# Patient Record
Sex: Female | Born: 1960 | Race: White | Hispanic: No | Marital: Married | Smoking: Never smoker
Health system: Southern US, Community
[De-identification: ages and names within clinical notes are randomized; demographics above are authoritative.]

## PROBLEM LIST (undated history)

## (undated) HISTORY — PX: CHOLECYSTECTOMY: SHX55

---

## 2004-01-24 ENCOUNTER — Inpatient Hospital Stay: Payer: Self-pay | Admitting: General Surgery

## 2004-01-24 ENCOUNTER — Other Ambulatory Visit: Payer: Self-pay

## 2017-05-13 ENCOUNTER — Encounter: Payer: Self-pay | Admitting: General Surgery

## 2019-03-31 ENCOUNTER — Emergency Department: Payer: BC Managed Care – PPO

## 2019-03-31 ENCOUNTER — Other Ambulatory Visit: Payer: Self-pay

## 2019-03-31 ENCOUNTER — Encounter: Payer: Self-pay | Admitting: Emergency Medicine

## 2019-03-31 ENCOUNTER — Inpatient Hospital Stay
Admission: EM | Admit: 2019-03-31 | Discharge: 2019-04-27 | DRG: 871 | Disposition: E | Payer: BC Managed Care – PPO | Attending: Internal Medicine | Admitting: Internal Medicine

## 2019-03-31 ENCOUNTER — Inpatient Hospital Stay: Payer: BC Managed Care – PPO

## 2019-03-31 DIAGNOSIS — I5021 Acute systolic (congestive) heart failure: Secondary | ICD-10-CM | POA: Diagnosis present

## 2019-03-31 DIAGNOSIS — R6520 Severe sepsis without septic shock: Secondary | ICD-10-CM

## 2019-03-31 DIAGNOSIS — Z8249 Family history of ischemic heart disease and other diseases of the circulatory system: Secondary | ICD-10-CM | POA: Diagnosis not present

## 2019-03-31 DIAGNOSIS — J9601 Acute respiratory failure with hypoxia: Secondary | ICD-10-CM | POA: Diagnosis present

## 2019-03-31 DIAGNOSIS — A419 Sepsis, unspecified organism: Principal | ICD-10-CM

## 2019-03-31 DIAGNOSIS — Z79899 Other long term (current) drug therapy: Secondary | ICD-10-CM

## 2019-03-31 DIAGNOSIS — N179 Acute kidney failure, unspecified: Secondary | ICD-10-CM | POA: Diagnosis present

## 2019-03-31 DIAGNOSIS — Z20822 Contact with and (suspected) exposure to covid-19: Secondary | ICD-10-CM | POA: Diagnosis present

## 2019-03-31 DIAGNOSIS — I255 Ischemic cardiomyopathy: Secondary | ICD-10-CM | POA: Diagnosis present

## 2019-03-31 DIAGNOSIS — I469 Cardiac arrest, cause unspecified: Secondary | ICD-10-CM | POA: Diagnosis not present

## 2019-03-31 DIAGNOSIS — Z6829 Body mass index (BMI) 29.0-29.9, adult: Secondary | ICD-10-CM

## 2019-03-31 DIAGNOSIS — R6521 Severe sepsis with septic shock: Secondary | ICD-10-CM | POA: Diagnosis present

## 2019-03-31 DIAGNOSIS — R092 Respiratory arrest: Secondary | ICD-10-CM

## 2019-03-31 DIAGNOSIS — Z66 Do not resuscitate: Secondary | ICD-10-CM | POA: Diagnosis not present

## 2019-03-31 DIAGNOSIS — J189 Pneumonia, unspecified organism: Secondary | ICD-10-CM | POA: Diagnosis present

## 2019-03-31 DIAGNOSIS — Z833 Family history of diabetes mellitus: Secondary | ICD-10-CM

## 2019-03-31 DIAGNOSIS — I248 Other forms of acute ischemic heart disease: Secondary | ICD-10-CM | POA: Diagnosis present

## 2019-03-31 DIAGNOSIS — R778 Other specified abnormalities of plasma proteins: Secondary | ICD-10-CM | POA: Diagnosis present

## 2019-03-31 DIAGNOSIS — R0602 Shortness of breath: Secondary | ICD-10-CM

## 2019-03-31 DIAGNOSIS — J9602 Acute respiratory failure with hypercapnia: Secondary | ICD-10-CM | POA: Diagnosis present

## 2019-03-31 DIAGNOSIS — R7989 Other specified abnormal findings of blood chemistry: Secondary | ICD-10-CM | POA: Diagnosis present

## 2019-03-31 DIAGNOSIS — R570 Cardiogenic shock: Secondary | ICD-10-CM | POA: Insufficient documentation

## 2019-03-31 LAB — CBC WITH DIFFERENTIAL/PLATELET
Abs Immature Granulocytes: 0.4 10*3/uL — ABNORMAL HIGH (ref 0.00–0.07)
Basophils Absolute: 0.1 10*3/uL (ref 0.0–0.1)
Basophils Relative: 0 %
Eosinophils Absolute: 0.1 10*3/uL (ref 0.0–0.5)
Eosinophils Relative: 0 %
HCT: 31.1 % — ABNORMAL LOW (ref 36.0–46.0)
Hemoglobin: 8.4 g/dL — ABNORMAL LOW (ref 12.0–15.0)
Immature Granulocytes: 1 %
Lymphocytes Relative: 6 %
Lymphs Abs: 1.8 10*3/uL (ref 0.7–4.0)
MCH: 16.6 pg — ABNORMAL LOW (ref 26.0–34.0)
MCHC: 27 g/dL — ABNORMAL LOW (ref 30.0–36.0)
MCV: 61.6 fL — ABNORMAL LOW (ref 80.0–100.0)
Monocytes Absolute: 1.9 10*3/uL — ABNORMAL HIGH (ref 0.1–1.0)
Monocytes Relative: 7 %
Neutro Abs: 24.6 10*3/uL — ABNORMAL HIGH (ref 1.7–7.7)
Neutrophils Relative %: 86 %
Platelets: 1011 10*3/uL (ref 150–400)
RBC: 5.05 MIL/uL (ref 3.87–5.11)
RDW: 21.9 % — ABNORMAL HIGH (ref 11.5–15.5)
WBC: 28.9 10*3/uL — ABNORMAL HIGH (ref 4.0–10.5)
nRBC: 0.2 % (ref 0.0–0.2)

## 2019-03-31 LAB — D-DIMER, QUANTITATIVE: D-Dimer, Quant: 5.76 ug/mL-FEU — ABNORMAL HIGH (ref 0.00–0.50)

## 2019-03-31 LAB — COMPREHENSIVE METABOLIC PANEL
ALT: 18 U/L (ref 0–44)
AST: 41 U/L (ref 15–41)
Albumin: 2.6 g/dL — ABNORMAL LOW (ref 3.5–5.0)
Alkaline Phosphatase: 109 U/L (ref 38–126)
Anion gap: 11 (ref 5–15)
BUN: 14 mg/dL (ref 6–20)
CO2: 23 mmol/L (ref 22–32)
Calcium: 8 mg/dL — ABNORMAL LOW (ref 8.9–10.3)
Chloride: 101 mmol/L (ref 98–111)
Creatinine, Ser: 1.05 mg/dL — ABNORMAL HIGH (ref 0.44–1.00)
GFR calc Af Amer: >60 mL/min (ref >60)
GFR calc non Af Amer: 59 mL/min — ABNORMAL LOW (ref >60)
Glucose, Bld: 210 mg/dL — ABNORMAL HIGH (ref 70–99)
Potassium: 4 mmol/L (ref 3.5–5.1)
Sodium: 135 mmol/L (ref 135–145)
Total Bilirubin: 0.8 mg/dL (ref 0.3–1.2)
Total Protein: 6.8 g/dL (ref 6.5–8.1)

## 2019-03-31 LAB — BLOOD GAS, ARTERIAL
Acid-base deficit: 0.5 mmol/L (ref 0.0–2.0)
Bicarbonate: 24.2 mmol/L (ref 20.0–28.0)
Expiratory PAP: 10
FIO2: 0.7
Inspiratory PAP: 15
Mode: POSITIVE
O2 Saturation: 89.7 %
Patient temperature: 37
pCO2 arterial: 39 mmHg (ref 32.0–48.0)
pH, Arterial: 7.4 (ref 7.350–7.450)
pO2, Arterial: 58 mmHg — ABNORMAL LOW (ref 83.0–108.0)

## 2019-03-31 LAB — STREP PNEUMONIAE URINARY ANTIGEN: Strep Pneumo Urinary Antigen: NEGATIVE

## 2019-03-31 LAB — POC SARS CORONAVIRUS 2 AG: SARS Coronavirus 2 Ag: NEGATIVE

## 2019-03-31 LAB — URINE DRUG SCREEN, QUALITATIVE (ARMC ONLY)
Amphetamines, Ur Screen: NOT DETECTED
Barbiturates, Ur Screen: NOT DETECTED
Benzodiazepine, Ur Scrn: NOT DETECTED
Cannabinoid 50 Ng, Ur ~~LOC~~: NOT DETECTED
Cocaine Metabolite,Ur ~~LOC~~: NOT DETECTED
MDMA (Ecstasy)Ur Screen: NOT DETECTED
Methadone Scn, Ur: NOT DETECTED
Opiate, Ur Screen: NOT DETECTED
Phencyclidine (PCP) Ur S: NOT DETECTED
Tricyclic, Ur Screen: NOT DETECTED

## 2019-03-31 LAB — PATHOLOGIST SMEAR REVIEW

## 2019-03-31 LAB — PROCALCITONIN: Procalcitonin: 0.75 ng/mL

## 2019-03-31 LAB — RESPIRATORY PANEL BY RT PCR (FLU A&B, COVID)
Influenza A by PCR: NEGATIVE
Influenza B by PCR: NEGATIVE
SARS Coronavirus 2 by RT PCR: NEGATIVE

## 2019-03-31 LAB — LACTIC ACID, PLASMA
Lactic Acid, Venous: 3.7 mmol/L (ref 0.5–1.9)
Lactic Acid, Venous: 2.6 mmol/L (ref 0.5–1.9)

## 2019-03-31 LAB — C-REACTIVE PROTEIN: CRP: 32.3 mg/dL — ABNORMAL HIGH (ref <1.0)

## 2019-03-31 LAB — TROPONIN I (HIGH SENSITIVITY)
Troponin I (High Sensitivity): 35 ng/L — ABNORMAL HIGH (ref <18)
Troponin I (High Sensitivity): 80 ng/L — ABNORMAL HIGH (ref <18)

## 2019-03-31 LAB — FERRITIN: Ferritin: 15 ng/mL (ref 11–307)

## 2019-03-31 LAB — LACTATE DEHYDROGENASE: LDH: 485 U/L — ABNORMAL HIGH (ref 98–192)

## 2019-03-31 MED ORDER — SODIUM CHLORIDE 0.9 % IV SOLN
500.0000 mg | Freq: Once | INTRAVENOUS | Status: DC
  Filled 2019-03-31: qty 500

## 2019-03-31 MED ORDER — SODIUM CHLORIDE 0.9 % IV SOLN
500.0000 mg | Freq: Once | INTRAVENOUS | Status: AC
  Administered 2019-03-31: 07:00:00 500 mg via INTRAVENOUS
  Filled 2019-03-31: qty 500

## 2019-03-31 MED ORDER — VANCOMYCIN HCL 1500 MG/300ML IV SOLN
1500.0000 mg | Freq: Once | INTRAVENOUS | Status: AC
  Administered 2019-03-31: 1500 mg via INTRAVENOUS
  Filled 2019-03-31: qty 300

## 2019-03-31 MED ORDER — SODIUM CHLORIDE 0.9 % IV SOLN: INTRAVENOUS | Status: DC

## 2019-03-31 MED ORDER — ALBUTEROL SULFATE HFA 108 (90 BASE) MCG/ACT IN AERS: 2.0000 | INHALATION_SPRAY | RESPIRATORY_TRACT | Status: DC | PRN

## 2019-03-31 MED ORDER — IOHEXOL 350 MG/ML SOLN
75.0000 mL | Freq: Once | INTRAVENOUS | Status: AC | PRN
  Administered 2019-03-31: 06:00:00 75 mL via INTRAVENOUS

## 2019-03-31 MED ORDER — SODIUM BICARBONATE 8.4 % IV SOLN
50.0000 meq | Freq: Once | INTRAVENOUS | Status: AC
  Administered 2019-03-31: 14:00:00 50 meq via INTRAVENOUS

## 2019-03-31 MED ORDER — PROPOFOL 1000 MG/100ML IV EMUL: 5.0000 ug/kg/min | INTRAVENOUS | Status: DC

## 2019-03-31 MED ORDER — DOPAMINE-DEXTROSE 3.2-5 MG/ML-% IV SOLN
0.0000 ug/kg/min | INTRAVENOUS | Status: DC
  Administered 2019-03-31: 5 ug/kg/min via INTRAVENOUS

## 2019-03-31 MED ORDER — EPINEPHRINE 1 MG/10ML IJ SOSY: 0.1000 mg | PREFILLED_SYRINGE | Freq: Once | INTRAMUSCULAR | Status: DC

## 2019-03-31 MED ORDER — ACETAMINOPHEN 325 MG PO TABS: 650.0000 mg | ORAL_TABLET | Freq: Four times a day (QID) | ORAL | Status: DC | PRN

## 2019-03-31 MED ORDER — ENOXAPARIN SODIUM 40 MG/0.4ML ~~LOC~~ SOLN: 40.0000 mg | SUBCUTANEOUS | Status: DC

## 2019-03-31 MED ORDER — SODIUM CHLORIDE 0.9 % IV SOLN
2.0000 g | Freq: Once | INTRAVENOUS | Status: DC
  Filled 2019-03-31: qty 20

## 2019-03-31 MED ORDER — DM-GUAIFENESIN ER 30-600 MG PO TB12
1.0000 | ORAL_TABLET | Freq: Two times a day (BID) | ORAL | Status: DC
  Filled 2019-03-31 (×2): qty 1

## 2019-03-31 MED ORDER — ATROPINE SULFATE 1 MG/ML IJ SOLN
INTRAMUSCULAR | Status: AC | PRN
  Administered 2019-03-31: 1 mg via INTRAVENOUS

## 2019-03-31 MED ORDER — SODIUM BICARBONATE 8.4 % IV SOLN
INTRAVENOUS | Status: AC | PRN
  Administered 2019-03-31: 50 meq via INTRAVENOUS

## 2019-03-31 MED ORDER — DEXAMETHASONE SODIUM PHOSPHATE 10 MG/ML IJ SOLN
6.0000 mg | Freq: Once | INTRAMUSCULAR | Status: AC
  Administered 2019-03-31: 6 mg via INTRAVENOUS
  Filled 2019-03-31: qty 1

## 2019-03-31 MED ORDER — IPRATROPIUM BROMIDE HFA 17 MCG/ACT IN AERS: 2.0000 | INHALATION_SPRAY | RESPIRATORY_TRACT | Status: DC

## 2019-03-31 MED ORDER — ALBUTEROL SULFATE (2.5 MG/3ML) 0.083% IN NEBU: 2.5000 mg | INHALATION_SOLUTION | RESPIRATORY_TRACT | Status: DC | PRN

## 2019-03-31 MED ORDER — EPINEPHRINE 1 MG/10ML IJ SOSY
PREFILLED_SYRINGE | INTRAMUSCULAR | Status: AC | PRN
  Administered 2019-03-31: 1 mg via INTRAVENOUS

## 2019-03-31 MED ORDER — SODIUM CHLORIDE 0.9 % IV BOLUS
500.0000 mL | Freq: Once | INTRAVENOUS | Status: AC
  Administered 2019-03-31: 07:00:00 500 mL via INTRAVENOUS

## 2019-03-31 MED ORDER — EPINEPHRINE 1 MG/10ML IJ SOSY
1.0000 mg | PREFILLED_SYRINGE | Freq: Once | INTRAMUSCULAR | Status: AC
  Administered 2019-03-31: 14:00:00 1 mg via INTRAVENOUS

## 2019-03-31 MED ORDER — ROCURONIUM BROMIDE 50 MG/5ML IV SOLN
INTRAVENOUS | Status: AC | PRN
  Administered 2019-03-31: 40 mg via INTRAVENOUS

## 2019-03-31 MED ORDER — EPINEPHRINE PF 1 MG/ML IJ SOLN
0.5000 ug/min | INTRAVENOUS | Status: DC
  Filled 2019-03-31: qty 4

## 2019-03-31 MED ORDER — LORAZEPAM 2 MG/ML IJ SOLN
0.5000 mg | Freq: Once | INTRAMUSCULAR | Status: AC
  Administered 2019-03-31: 12:00:00 0.5 mg via INTRAVENOUS
  Filled 2019-03-31: qty 1

## 2019-03-31 MED ORDER — PROPOFOL 1000 MG/100ML IV EMUL
INTRAVENOUS | Status: AC
  Administered 2019-03-31: 13:00:00 10 ug/kg/min via INTRAVENOUS
  Filled 2019-03-31: qty 100

## 2019-03-31 MED ORDER — ALBUTEROL SULFATE (2.5 MG/3ML) 0.083% IN NEBU: 2.5000 mg | INHALATION_SOLUTION | RESPIRATORY_TRACT | Status: DC

## 2019-03-31 MED ORDER — IPRATROPIUM BROMIDE 0.02 % IN SOLN: 0.5000 mg | RESPIRATORY_TRACT | Status: DC

## 2019-03-31 MED ORDER — SODIUM BICARBONATE-DEXTROSE 150-5 MEQ/L-% IV SOLN: 150.0000 meq | INTRAVENOUS | Status: DC

## 2019-03-31 MED ORDER — SODIUM CHLORIDE 0.9 % IV SOLN
2.0000 g | Freq: Once | INTRAVENOUS | Status: AC
  Administered 2019-03-31: 06:00:00 2 g via INTRAVENOUS
  Filled 2019-03-31: qty 20

## 2019-03-31 MED ORDER — EPINEPHRINE PF 1 MG/ML IJ SOLN
1.0000 mg | Freq: Once | INTRAMUSCULAR | Status: AC
  Administered 2019-03-31: 13:00:00 1 mg via INTRAVENOUS

## 2019-03-31 MED ORDER — EPINEPHRINE 1 MG/10ML IJ SOSY
PREFILLED_SYRINGE | INTRAMUSCULAR | Status: AC | PRN
  Administered 2019-03-31: 1 via INTRAVENOUS

## 2019-03-31 MED ORDER — EPINEPHRINE PF 1 MG/ML IJ SOLN
1.0000 mg | Freq: Once | INTRAMUSCULAR | Status: AC
  Administered 2019-03-31: 14:00:00 1 mg via INTRAVENOUS

## 2019-03-31 MED ORDER — SODIUM CHLORIDE 0.9 % IV BOLUS
1000.0000 mL | Freq: Once | INTRAVENOUS | Status: AC
  Administered 2019-03-31: 09:00:00 1000 mL via INTRAVENOUS

## 2019-03-31 MED ORDER — SODIUM CHLORIDE 0.9 % IV BOLUS
1000.0000 mL | Freq: Once | INTRAVENOUS | Status: AC
  Administered 2019-03-31: 1000 mL via INTRAVENOUS

## 2019-03-31 MED ORDER — ONDANSETRON HCL 4 MG/2ML IJ SOLN: 4.0000 mg | Freq: Three times a day (TID) | INTRAMUSCULAR | Status: DC | PRN

## 2019-04-01 MED FILL — Medication: Qty: 1 | Status: AC

## 2019-04-02 LAB — LEGIONELLA PNEUMOPHILA SEROGP 1 UR AG: L. pneumophila Serogp 1 Ur Ag: NEGATIVE

## 2019-04-05 LAB — CULTURE, BLOOD (ROUTINE X 2)
Culture: NO GROWTH
Culture: NO GROWTH
Special Requests: ADEQUATE
Special Requests: ADEQUATE

## 2019-04-27 NOTE — ED Notes (Signed)
Pt with saturation 0%, pt bradycardic in 40's without a pulse.  CPR started at this time.  Dr. Jayme Cloud called back to bedside.

## 2019-04-27 NOTE — ED Notes (Signed)
Patient transported to CT 

## 2019-04-27 NOTE — Procedures (Addendum)
Endotracheal Intubation: Patient required placement of an artificial airway secondary to respiratory arrest before cardiac arrest  Consent: Emergent.   Hand washing hed been performed prior to entering room.   Medications administered for sedation prior to procedure:  N/A, pt. obtunded   All equipment needed available.  Patient was positioned to align the mouth and pharynx to facilitate visualization of the glottis.  Pt in CODE BLUE situation.. Pre-oxygenation was conducted prior to intubation and endotracheal tube was placed through the vocal cords into the trachea.    The artificial airway was placed under direct visualization via glidescope using a # 4 blade 7.5  ETT  Was placed orotracheally on the first attempt. Pt. Had copious secretions.  ETT was secured at 22 cm mark.  Placement was confirmed by auscuitation of lungs with good breath sounds bilaterally and no stomach sounds.  Condensation was noted on endotracheal tube.   Pulse ox 98%.  CO2 detector in place with appropriate color change.   Complications: None .   Operator: Jayme Cloud  Chest radiograph ordered: Diffuse airspace disease, ETT in good position.     Gailen Shelter, MD [facility]

## 2019-04-27 NOTE — ED Notes (Signed)
Pulses regained at this time.

## 2019-04-27 NOTE — ED Notes (Signed)
Pt 0% on ventilator; removed and bagged per RT w/ saturations up to 54%.  Vent settings changed per verbal order from Intensivist, Jayme Cloud and placed back on Vent.

## 2019-04-27 NOTE — ED Notes (Signed)
Pulses regained at this time; CPR stopped.

## 2019-04-27 NOTE — Progress Notes (Signed)
CODE SEPSIS - PHARMACY COMMUNICATION  **Broad Spectrum Antibiotics should be administered within 1 hour of Sepsis diagnosis**  Time Code Sepsis Called/Page Received: 0630  Antibiotics Ordered: Rocephin and Zithromax  Time of 1st antibiotic administration: 0548, Rocephin  Additional action taken by pharmacy: n/a   If necessary, Name of Provider/Nurse Contacted: n/a    Wayland Denis ,PharmD Clinical Pharmacist  2019-04-18  6:31 AM

## 2019-04-27 NOTE — ED Notes (Signed)
Pt with o2 sats in 70's on 10L HFNC. Pt switched to 15L NRB and pox sensor moved. sats still 70's. Dr. Lenard Lance to bedside. Bi-pap, ABG oredered, RT called and on way.

## 2019-04-27 NOTE — Progress Notes (Signed)
Family At bedside, clinical status relayed to family  Updated and notified of patients medical condition-  Progressive multiorgan failure with very low chance of meaningful recovery.  Patient is in dying  Process.  Family understands the situation.  They have consented and agreed to DNR.   Family are satisfied with Plan of action and management. All questions answered  Jymir Dunaj David Pacer Dorn, M.D.  [facility]  Medical Director ICU-[facility] Medical Director [facility] Department     

## 2019-04-27 NOTE — Consult Note (Signed)
PHARMACY -  BRIEF ANTIBIOTIC NOTE   Pharmacy has received consult(s) for Vancomycin from an ED provider.  The patient's profile has been reviewed for ht/wt/allergies/indication/available labs.    One time order(s) placed for Vancomycin 1500mg  x 1 dose  Further antibiotics/pharmacy consults should be ordered by admitting physician if indicated.                       Thank you, , PharmD, BCPS Clinical Pharmacist April 10, 2019 8:12 AM

## 2019-04-27 NOTE — ED Triage Notes (Signed)
Pt to triage via w/c, mask in place, no distress noted; Pt reports nonprod cough x wk with [facility], worse tonight

## 2019-04-27 NOTE — Code Documentation (Signed)
Femoral pulse felt by Dr. Lenard Lance. Pulses were weak, pt was bradying down. ICU MD wanted 1 epi syringe given. Pt taken off bipap and ambu bag used at this time. Pt vomited and stopped breathing per ICU MD.

## 2019-04-27 NOTE — ED Provider Notes (Signed)
-----------------------------------------   8:48 AM on Apr 23, 2019 -----------------------------------------  Patient care assumed from Dr. Erma Heritage.  Patient has what appears to be very significant multifocal pneumonia on CT scan.  PCR Covid test is negative.  Patient receiving broad-spectrum antibiotics meeting sepsis criteria.  Discussed the patient with the intensivist, states he believes the patient should be in stepdown, he will consult on the patient but asked me to admit to the hospitalist service.  I have paged the hospitalist.  ----------------------------------------- 9:20 AM on 2019/04/23 -----------------------------------------  Patient appears to have decompensated somewhat.  Currently satting in the 70s with a pulse oximeter on her toe, difficulty getting the pulse ox to read on her finger or ear.  We will obtain an ABG for a more accurate oxygenation evaluation.  Currently on a nonrebreather mass with sats in the 70s we will place the patient on BiPAP.  I had a discussion with the patient regarding intubation she is not opposed to intubation if absolutely needed but wishes to try the BiPAP mask first.  ----------------------------------------- 10:12 AM on 23-Apr-2019 -----------------------------------------  Patient doing much better on BiPAP satting in the 90s.  Hospitalist and intensivist are aware.   Minna Antis, MD Apr 23, 2019 1012

## 2019-04-27 NOTE — ED Provider Notes (Signed)
[facility] Emergency Department Provider Note  ____________________________________________   First MD Initiated Contact with Patient Apr 20, 2019 [phone]     (approximate)  I have reviewed the triage vital signs and the nursing notes.   HISTORY  Chief Complaint Shortness of Breath    HPI Ebony Flores is a 59 y.o. female  Here with shortness of breath. Per pt's report, starting last week she developed mild fatigue, chills, cough, SOB, and diarrhea. She had no known sick contacts. Over the last week, she initially felt better but over the past 2-3 days has had acute worsening of severe SOB. She is now SOB even at rest. This evening she became abruptly more SOB an subsequently came to the ED. Found to be hypoxi to the 70s on arrival. Pt otherwise healthy. She denies any known lung disease and does not smoke. No other complaints. No abd pain, but has had some diarrhea.       History reviewed. No pertinent past medical history.  There are no problems to display for this patient.   Past Surgical History:  Procedure Laterality Date  . CHOLECYSTECTOMY      Prior to Admission medications   Medication Sig Start Date End Date Taking? Authorizing Provider  acetaminophen (TYLENOL) 325 MG tablet Take 650 mg by mouth every 6 (six) hours as needed.   Yes [provider]    Allergies Patient has no known allergies.  No family history on file.  Social History Social History   Tobacco Use  . Smoking status: Never Smoker  . Smokeless tobacco: Never Used  Substance Use Topics  . Alcohol use: Not on file  . Drug use: Not on file    Review of Systems  Review of Systems  Constitutional: Positive for chills and fatigue. Negative for fever.  HENT: Negative for congestion and sore throat.   Eyes: Negative for visual disturbance.  Respiratory: Positive for cough, shortness of breath and wheezing.   Cardiovascular: Negative for chest pain.    Gastrointestinal: Positive for diarrhea and nausea. Negative for abdominal pain and vomiting.  Genitourinary: Negative for flank pain.  Musculoskeletal: Negative for back pain and neck pain.  Skin: Negative for rash and wound.  Neurological: Positive for weakness.  All other systems reviewed and are negative.    ____________________________________________  PHYSICAL EXAM:      VITAL SIGNS: ED Triage Vitals [04/20/2019 0408]  Enc Vitals Group     BP 121/63     Pulse Rate (!) 115     Resp (!) 32     Temp 98 F (36.7 C)     Temp Source Oral     SpO2 (!) 70 %     Weight 165 lb (74.8 kg)     Height 5\' 3"  (1.6 m)     Head Circumference      Peak Flow      Pain Score 0     Pain Loc      Pain Edu?      Excl. in GC?      Physical Exam Vitals and nursing note reviewed.  Constitutional:      General: She is not in acute distress.    Appearance: She is well-developed. She is ill-appearing.  HENT:     Head: Normocephalic and atraumatic.  Eyes:     Conjunctiva/sclera: Conjunctivae normal.  Cardiovascular:     Rate and Rhythm: Regular rhythm. Tachycardia present.     Heart sounds: Normal heart sounds. No murmur.  No friction rub.  Pulmonary:     Effort: Pulmonary effort is normal. Tachypnea present. No respiratory distress.     Breath sounds: Examination of the right-upper field reveals rales. Examination of the left-upper field reveals rales. Examination of the right-middle field reveals rales. Examination of the left-middle field reveals rales. Examination of the right-lower field reveals rales. Examination of the left-lower field reveals rales. Decreased breath sounds, rhonchi and rales present. No wheezing.  Abdominal:     General: There is no distension.     Palpations: Abdomen is soft.     Tenderness: There is no abdominal tenderness.  Musculoskeletal:     Cervical back: Neck supple.  Skin:    General: Skin is warm.     Capillary Refill: Capillary refill takes less than  2 seconds.  Neurological:     Mental Status: She is alert and oriented to person, place, and time.     Motor: No abnormal muscle tone.       ____________________________________________   LABS (all labs ordered are listed, but only abnormal results are displayed)  Labs Reviewed  CBC WITH DIFFERENTIAL/PLATELET - Abnormal; Notable for the following components:      Result Value   WBC 28.9 (*)    Hemoglobin 8.4 (*)    HCT 31.1 (*)    MCV 61.6 (*)    MCH 16.6 (*)    MCHC 27.0 (*)    RDW 21.9 (*)    Platelets 1,011 (*)    Neutro Abs 24.6 (*)    Monocytes Absolute 1.9 (*)    Abs Immature Granulocytes 0.40 (*)    All other components within normal limits  COMPREHENSIVE METABOLIC PANEL - Abnormal; Notable for the following components:   Glucose, Bld 210 (*)    Creatinine, Ser 1.05 (*)    Calcium 8.0 (*)    Albumin 2.6 (*)    GFR calc non Af Amer 59 (*)    All other components within normal limits  LACTATE DEHYDROGENASE - Abnormal; Notable for the following components:   LDH 485 (*)    All other components within normal limits  LACTIC ACID, PLASMA - Abnormal; Notable for the following components:   Lactic Acid, Venous 3.7 (*)    All other components within normal limits  TROPONIN I (HIGH SENSITIVITY) - Abnormal; Notable for the following components:   Troponin I (High Sensitivity) 35 (*)    All other components within normal limits  CULTURE, BLOOD (ROUTINE X 2)  CULTURE, BLOOD (ROUTINE X 2)  RESPIRATORY PANEL BY RT PCR (FLU A&B, COVID)  PROCALCITONIN  FERRITIN  C-REACTIVE PROTEIN  LACTIC ACID, PLASMA  D-DIMER, QUANTITATIVE (NOT AT [facility])  PATHOLOGIST SMEAR REVIEW  POC SARS CORONAVIRUS 2 AG -  ED  POC SARS CORONAVIRUS 2 AG  POC URINE PREG, ED    ____________________________________________  EKG: Sinus tachycardia, VR 118. PR 134, QRS 68, QTc 454. No acute St elevations or depressions. No ischemia or infarct. ________________________________________  RADIOLOGY All  imaging, including plain films, CT scans, and ultrasounds, independently reviewed by me, and interpretations confirmed via formal radiology reads.  ED MD interpretation:   CXR: Multifocal PNA CT Angio: ARDS vs COVID-19/viral pneumonitis, severe; no PE  Official radiology report(s): DG Chest 1 View  Result Date: 04-01-2019 CLINICAL DATA:  Shortness of breath EXAM: CHEST  1 VIEW COMPARISON:  None. FINDINGS: Bilateral pulmonary opacity that is extensive and asymmetric to the right. There could be pleural fluid on the right. Superimposed heart size appears  normal. No evidence of pneumothorax. IMPRESSION: Extensive bilateral pneumonia with possible right pleural fluid. Electronically Signed   By: Monte Fantasia M.D.   On: 04-19-19 04:40   CT Angio Chest PE W and/or Wo Contrast  Result Date: April 19, 2019 CLINICAL DATA:  Shortness of breath, nonproductive cough, acutely worsening EXAM: CT ANGIOGRAPHY CHEST WITH CONTRAST TECHNIQUE: Multidetector CT imaging of the chest was performed using the standard protocol during bolus administration of intravenous contrast. Multiplanar CT image reconstructions and MIPs were obtained to evaluate the vascular anatomy. CONTRAST:  39mL OMNIPAQUE IOHEXOL 350 MG/ML SOLN COMPARISON:  Radiograph 2019-04-19 FINDINGS: Cardiovascular: Satisfactory opacification the pulmonary arteries to the segmental level. No pulmonary artery filling defects are identified. Central pulmonary arteries are normal caliber. Cardiac size at the upper limits of normal. No pericardial effusion. The aorta is normal caliber. Normal 3 vessel branching of the arch. Proximal great vessels opacified normally. Mediastinum/Nodes: Scattered subcentimeter low-attenuation mediastinal and hilar nodes. No pathologically enlarged mediastinal, hilar or axillary adenopathy. Moderate hiatal hernia. No acute tracheal abnormality. Thyroid gland and thoracic inlet are unremarkable. Lungs/Pleura: There is bilobed mixed  consolidative, reticular and ground-glass opacity throughout both lungs including areas of bronchial dilatation within regions of ground-glass most notably in the left lung base and throughout the right hemithorax. Small right pleural effusion is present with some dependent atelectasis posteriorly. Upper Abdomen: No acute abnormalities present in the visualized portions of the upper abdomen. Musculoskeletal: Multilevel degenerative changes are present in the imaged portions of the spine. No acute osseous abnormality or suspicious osseous lesion. Probable hemangioma in the T9 vertebral body. Rudimentary cervical ribs are noted. Review of the MIP images confirms the above findings. IMPRESSION: 1. No evidence for pulmonary embolus. 2. Multi focal mixed consolidative, reticular and ground-glass opacity throughout both lungs including areas of bronchial dilatation within regions of ground-glass opacity. Findings could reflect an acute infectious process including atypical viral pneumonia/pneumonitis and/or features of ARDS. 3. Small right pleural effusion. 4. Moderate hiatal hernia. Electronically Signed   By: Lovena Le M.D.   On: 2019-04-19 06:36    ____________________________________________  PROCEDURES   Procedure(s) performed (including Critical Care):  .Critical Care Performed by: Duffy Bruce, MD Authorized by: Duffy Bruce, MD   Critical care provider statement:    Critical care time (minutes):  35   Critical care time was exclusive of:  Separately billable procedures and treating other patients and teaching time   Critical care was necessary to treat or prevent imminent or life-threatening deterioration of the following conditions:  Cardiac failure, circulatory failure and respiratory failure   Critical care was time spent personally by me on the following activities:  Development of treatment plan with patient or surrogate, discussions with consultants, evaluation of patient's  response to treatment, examination of patient, obtaining history from patient or surrogate, ordering and performing treatments and interventions, ordering and review of laboratory studies, ordering and review of radiographic studies, pulse oximetry, re-evaluation of patient's condition and review of old charts   I assumed direction of critical care for this patient from another provider in my specialty: no      ____________________________________________  INITIAL IMPRESSION / MDM / [place] / ED COURSE  As part of my medical decision making, I reviewed the following data within the [place] notes reviewed and incorporated, Old chart reviewed, Notes from prior ED visits, and [place] Controlled Substance Database       *Ebony Flores was evaluated in Emergency Department on 19-Apr-2019 for the  symptoms described in the history of present illness. She was evaluated in the context of the global COVID-19 pandemic, which necessitated consideration that the patient might be at risk for infection with the SARS-CoV-2 virus that causes COVID-19. Institutional protocols and algorithms that pertain to the evaluation of patients at risk for COVID-19 are in a state of rapid change based on information released by regulatory bodies including the CDC and federal and state organizations. These policies and algorithms were followed during the patient's care in the ED.  Some ED evaluations and interventions may be delayed as a result of limited staffing during the pandemic.*     Medical Decision Making:  59 yo F here with hypoxic respiratory failure. Initially improved very quickly on 4L [place]. However, she remains intermittently hypoxic despite 6-8L. Imaging, labs as above. Concern for severe sepsis with ARDS-like respiratory failure. My primary concern is COVID-19 clinically, though rapid neg. Labs show marked leukocytosis, thrombocytposis, lactic acidosis. Renal function normal. Will  cover empirically, plan to admit to ICU.  Rocephin/Azithro initially given for CAP. Will add on Vanc in event of post-viral PNA given sx last week. IVF continued.    ____________________________________________  FINAL CLINICAL IMPRESSION(S) / ED DIAGNOSES  Final diagnoses:  Acute respiratory failure with hypoxia (HCC)  Sepsis with acute hypoxic respiratory failure without septic shock, due to unspecified organism (HCC)     MEDICATIONS GIVEN DURING THIS VISIT:  Medications  vancomycin (VANCOREADY) IVPB 1500 mg/300 mL (has no administration in time range)  cefTRIAXone (ROCEPHIN) 2 g in sodium chloride 0.9 % 100 mL IVPB (0 g Intravenous Stopped 2019/04/08 0649)  azithromycin (ZITHROMAX) 500 mg in sodium chloride 0.9 % 250 mL IVPB (0 mg Intravenous Stopped 2019-04-08 0745)  sodium chloride 0.9 % bolus 500 mL (500 mLs Intravenous New Bag/Given 04/08/2019 0640)  iohexol (OMNIPAQUE) 350 MG/ML injection 75 mL (75 mLs Intravenous Contrast Given April 08, 2019 0620)  sodium chloride 0.9 % bolus 1,000 mL (1,000 mLs Intravenous New Bag/Given 04-08-2019 0649)  dexamethasone (DECADRON) injection 6 mg (6 mg Intravenous Given April 08, 2019 0649)  sodium chloride 0.9 % bolus 1,000 mL (1,000 mLs Intravenous New Bag/Given 04/08/19 4332)     ED Discharge Orders    None       Note:  This document was prepared using Dragon voice recognition software and may include unintentional dictation errors.   Shaune Pollack, MD 04/08/2019 [phone]

## 2019-04-27 NOTE — ED Notes (Signed)
Date and time results received: 04/27/2019  0940 (use smartphrase ".now" to insert current time)  Test: Lactic Critical Value: 2.6  Name of Provider Notified: Paduchowski, MD via verbal  Orders Received? Or Actions Taken?: no orders received

## 2019-04-27 NOTE — Progress Notes (Signed)
Pt pronounced dead at 1538 by this RN and CN Ladona Ridgel.  Family at bedside.  They question how this happened so quickly, their questions were answered.  CDS called, body prep will be completed after family leaves bedside.  Belongings were given to her family

## 2019-04-27 NOTE — ED Notes (Signed)
Pt transported to CCU per this RN, EDT, Vernona Rieger.  RT, and Intensivist, Jayme Cloud remain at bedside during transfer.

## 2019-04-27 NOTE — H&P (Signed)
Name: Ebony Flores MRN: [id number] DOB: 1960/11/14     CONSULTATION DATE: 07-Apr-2019  REFERRING MD :  Lenard Lance  CHIEF COMPLAINT:  resp distress   HISTORY OF PRESENT ILLNESS:   59 yo Morbidly obese WF with acute SOB for 1 week  Patient has what appears to be very significant multifocal pneumonia on CT scan.  PCR Covid test is negative.  Patient receiving broad-spectrum antibiotics meeting sepsis criteria.  Patient progressively declined and went into cardiac arrest She cardiac arrested several times(4 times) each lasting 3-5 minutes  Severe hypoxia and severe cardiogenic shock Multiorgan failure, now on vent and vasopressors CVL placed emergently  Husband notified of grave prognosis  Clinical status relayed to family  Updated and notified of patients medical condition-  Progressive multiorgan failure with very low chance of meaningful recovery.  Patient is in dying  Process.      PAST MEDICAL HISTORY :   has no past medical history on file.  has a past surgical history that includes Cholecystectomy. Prior to Admission medications   Medication Sig Start Date End Date Taking? Authorizing Provider  acetaminophen (TYLENOL) 325 MG tablet Take 650 mg by mouth every 6 (six) hours as needed.   Yes [provider]   No Known Allergies  FAMILY HISTORY:  HTN SOCIAL HISTORY:  reports that she has never smoked. She has never used smokeless tobacco.  REVIEW OF SYSTEMS:   Unable to obtain due to critical illness   VITAL SIGNS: Temp:  [98 F (36.7 C)] 98 F (36.7 C) (01/05 0408) Pulse Rate:  [74-115] 74 (01/05 1300) Resp:  [9-64] 24 (01/05 1355) BP: (98-186)/(43-165) 98/53 (01/05 1355) SpO2:  [0 %-95 %] 0 % (01/05 1355) Weight:  [74.8 kg] 74.8 kg (01/05 0408)   No intake/output data recorded. Total I/O In: 1500 [IV Piggyback:1500] Out: -    SpO2: (!) 0 % O2 Flow Rate (L/min): 7 L/min   Physical Examination:  GENERAL:critically ill appearing,  +resp distress HEAD: Normocephalic, atraumatic.  EYES: Pupils equal, round, reactive to light.  No scleral icterus.  MOUTH: Moist mucosal membrane. NECK: Supple. No JVD.  PULMONARY: +rhonchi, +wheezing CARDIOVASCULAR: S1 and S2. Regular rate and rhythm. No murmurs, rubs, or gallops.  GASTROINTESTINAL: Soft, nontender, -distended.  Positive bowel sounds.  MUSCULOSKELETAL: No swelling, clubbing, or edema.  NEUROLOGIC: obtunded SKIN:intact,warm,dry  I personally reviewed lab work that was obtained in last 24 hrs. CXR Independently reviewed-acute b/l infiltrates  MEDICATIONS: I have reviewed all medications and confirmed regimen as documented   CULTURE RESULTS   Recent Results (from the past 240 hour(s))  Blood culture (routine x 2)     Status: None (Preliminary result)   Collection Time: 04/07/2019  5:25 AM   Specimen: BLOOD  Result Value Ref Range Status   Specimen Description BLOOD RIGHT [facility]  Final   Special Requests   Final    BOTTLES DRAWN AEROBIC AND ANAEROBIC Blood Culture adequate volume   Culture   Final    NO GROWTH <12 HOURS Performed at [facility], [address] [place], [place] [address]    Report Status PENDING  Incomplete  Blood culture (routine x 2)     Status: None (Preliminary result)   Collection Time: April 07, 2019  5:25 AM   Specimen: BLOOD  Result Value Ref Range Status   Specimen Description BLOOD RIGHT ARM  Final   Special Requests   Final    BOTTLES DRAWN AEROBIC AND ANAEROBIC Blood Culture adequate volume   Culture  Final    NO GROWTH <12 HOURS Performed at [facility], [address] [place], [place] [address]    Report Status PENDING  Incomplete  Respiratory Panel by RT PCR (Flu A&B, Covid) - Nasopharyngeal Swab     Status: None   Collection Time: April 05, 2019  6:52 AM   Specimen: Nasopharyngeal Swab  Result Value Ref Range Status   SARS Coronavirus 2 by RT PCR NEGATIVE NEGATIVE Final    Comment: (NOTE) SARS-CoV-2 target  nucleic acids are NOT DETECTED. The SARS-CoV-2 RNA is generally detectable in upper respiratoy specimens during the acute phase of infection. The lowest concentration of SARS-CoV-2 viral copies this assay can detect is 131 copies/mL. A negative result does not preclude SARS-Cov-2 infection and should not be used as the sole basis for treatment or other patient management decisions. A negative result may occur with  improper specimen collection/handling, submission of specimen other than nasopharyngeal swab, presence of viral mutation(s) within the areas targeted by this assay, and inadequate number of viral copies (<131 copies/mL). A negative result must be combined with clinical observations, patient history, and epidemiological information. The expected result is Negative. Fact Sheet for Patients:  https://www.moore.com/ Fact Sheet for Healthcare Providers:  https://www.young.biz/ This test is not yet ap proved or cleared by the [place] FDA and  has been authorized for detection and/or diagnosis of SARS-CoV-2 by FDA under an Emergency Use Authorization (EUA). This EUA will remain  in effect (meaning this test can be used) for the duration of the COVID-19 declaration under Section 564(b)(1) of the Act, 21 U.S.C. section 360bbb-3(b)(1), unless the authorization is terminated or revoked sooner.    Influenza A by PCR NEGATIVE NEGATIVE Final   Influenza B by PCR NEGATIVE NEGATIVE Final    Comment: (NOTE) The Xpert Xpress SARS-CoV-2/FLU/RSV assay is intended as an aid in  the diagnosis of influenza from Nasopharyngeal swab specimens and  should not be used as a sole basis for treatment. Nasal washings and  aspirates are unacceptable for Xpert Xpress SARS-CoV-2/FLU/RSV  testing. Fact Sheet for Patients: https://www.moore.com/ Fact Sheet for Healthcare Providers: https://www.young.biz/ This test is not  yet approved or cleared by the [place] FDA and  has been authorized for detection and/or diagnosis of SARS-CoV-2 by  FDA under an Emergency Use Authorization (EUA). This EUA will remain  in effect (meaning this test can be used) for the duration of the  Covid-19 declaration under Section 564(b)(1) of the Act, 21  U.S.C. section 360bbb-3(b)(1), unless the authorization is  terminated or revoked. Performed at [facility], [address] [place], [place] [address]           IMAGING    DG Chest 1 View  Result Date: 04/05/19 CLINICAL DATA:  Shortness of breath EXAM: CHEST  1 VIEW COMPARISON:  None. FINDINGS: Bilateral pulmonary opacity that is extensive and asymmetric to the right. There could be pleural fluid on the right. Superimposed heart size appears normal. No evidence of pneumothorax. IMPRESSION: Extensive bilateral pneumonia with possible right pleural fluid. Electronically Signed   By: Marnee Spring M.D.   On: Apr 05, 2019 04:40   CT Angio Chest PE W and/or Wo Contrast  Result Date: 04-05-2019 CLINICAL DATA:  Shortness of breath, nonproductive cough, acutely worsening EXAM: CT ANGIOGRAPHY CHEST WITH CONTRAST TECHNIQUE: Multidetector CT imaging of the chest was performed using the standard protocol during bolus administration of intravenous contrast. Multiplanar CT image reconstructions and MIPs were obtained to evaluate the vascular anatomy. CONTRAST:  40mL OMNIPAQUE IOHEXOL 350 MG/ML SOLN COMPARISON:  Radiograph 04-17-19 FINDINGS: Cardiovascular: Satisfactory opacification the pulmonary arteries to the segmental level. No pulmonary artery filling defects are identified. Central pulmonary arteries are normal caliber. Cardiac size at the upper limits of normal. No pericardial effusion. The aorta is normal caliber. Normal 3 vessel branching of the arch. Proximal great vessels opacified normally. Mediastinum/Nodes: Scattered subcentimeter low-attenuation mediastinal and  hilar nodes. No pathologically enlarged mediastinal, hilar or axillary adenopathy. Moderate hiatal hernia. No acute tracheal abnormality. Thyroid gland and thoracic inlet are unremarkable. Lungs/Pleura: There is bilobed mixed consolidative, reticular and ground-glass opacity throughout both lungs including areas of bronchial dilatation within regions of ground-glass most notably in the left lung base and throughout the right hemithorax. Small right pleural effusion is present with some dependent atelectasis posteriorly. Upper Abdomen: No acute abnormalities present in the visualized portions of the upper abdomen. Musculoskeletal: Multilevel degenerative changes are present in the imaged portions of the spine. No acute osseous abnormality or suspicious osseous lesion. Probable hemangioma in the T9 vertebral body. Rudimentary cervical ribs are noted. Review of the MIP images confirms the above findings. IMPRESSION: 1. No evidence for pulmonary embolus. 2. Multi focal mixed consolidative, reticular and ground-glass opacity throughout both lungs including areas of bronchial dilatation within regions of ground-glass opacity. Findings could reflect an acute infectious process including atypical viral pneumonia/pneumonitis and/or features of ARDS. 3. Small right pleural effusion. 4. Moderate hiatal hernia. Electronically Signed   By: Kreg Shropshire M.D.   On: April 17, 2019 06:36   DG Chest Portable 1 View  Result Date: April 17, 2019 CLINICAL DATA:  Post intubation. EXAM: PORTABLE CHEST 1 VIEW COMPARISON:  CT chest and chest x-ray from same day. FINDINGS: Endotracheal tube tip 1.8 cm above the carina. The cardiomediastinal silhouette is obscured. Extensive right greater than left lung airspace disease has worsened since the prior chest x-ray. No pneumothorax or large pleural effusion. No acute osseous abnormality. IMPRESSION: 1. Appropriately positioned endotracheal tube. 2. Worsened extensive right greater than left lung  airspace disease potentially reflecting infection and/or ARDS. Electronically Signed   By: Obie Dredge M.D.   On: 04/17/19 13:43        Indwelling Urinary Catheter continued, requirement due to   Reason to continue Indwelling Urinary Catheter strict Intake/Output monitoring for hemodynamic instability   Central Line/ continued, requirement due to  Reason to continue [employer] Monitoring of central venous pressure or other hemodynamic parameters and poor IV access   Ventilator continued, requirement due to severe respiratory failure   Ventilator Sedation RASS 0 to -2      ASSESSMENT AND PLAN SYNOPSIS   Severe ACUTE Hypoxic and Hypercapnic Respiratory Failure from acute cardiogenic shock and acute cardiac failure ?pneumonia -continue Full MV support -continue Bronchodilator Therapy -Wean Fio2 and PEEP as tolerated  SHE IS NOT A CANDIDATE FOR HYPOTHERMIA PROTOCOL DUE TO RECURRENT CARDIAC ARREST AND PROCESS OF DYING   ACUTE SYSTOLIC CARDIAC FAILURE- EF unknwon -follow up cardiac enzymes as indicated    ACUTE KIDNEY INJURY/Renal Failure -follow chem 7 -follow UO -continue Foley Catheter-assess need -Avoid nephrotoxic agents  NEUROLOGY GCS<3T Poor prognosis  SHOCK-SEPSIS/HYPOVOLUMIC/CARDIOGENIC -use vasopressors to keep MAP>65 -follow ABG and LA -follow up cultures -emperic ABX -consider stress dose steroids -aggressive IV fluid resuscitation  CARDIAC ICU monitoring  ID -continue IV abx as prescibed -follow up cultures  GI GI PROPHYLAXIS as indicated  NUTRITIONAL STATUS DIET-->NPO Constipation protocol as indicated   ENDO - will use ICU hypoglycemic\Hyperglycemia protocol if needed  ELECTROLYTES -follow labs as needed -replace as needed -pharmacy consultation and following   DVT/GI PRX ordered TRANSFUSIONS AS NEEDED MONITOR FSBS ASSESS the need for LABS    Critical Care Time devoted to patient care services described in this  note is 47  minutes.   Overall, patient is critically ill, prognosis is guarded.  Patient with Multiorgan failure and at high risk for cardiac arrest and death.    Corrin Parker, M.D.  [facility]  Medical Director [place] Director [facility] Cardio-Pulmonary Department

## 2019-04-27 NOTE — Procedures (Signed)
Central Venous Catheter Placement:TRIPLE LUMEN Indication: Cardiopulmonary arrest with poor IV access  Consent:emergent  Hand washing was performed prior to entering room.  Full PPE utilized.  Procedure:   Emergent venous access needed.  Patient was prepped using strict sterile technique including chlorohexadine preps, sterile drape,  and sterile gloves.    The area was prepped, and draped in the usual sterile manner.  Patient was obtunded and unresponsive, not responsive to pain.    A triple lumen catheter was placed in right femoral vein There was good blood return, catheter caps were placed on lumens, catheter flushed easily, the line was secured and a sterile dressing and BIO-PATCH applied.   Ultrasound was used to visualize vasculature and guidance of needle.   Number of Attempts: 1 Complications:none Estimated Blood Loss: none Chest Radiograph not indicated as lying in femoral position.  Line ready for immediate use. Operator: Georgetta Haber. Danice Goltz, MD [place] [facility]

## 2019-04-27 NOTE — ED Notes (Signed)
Intensivist, Ebony Flores remains at bedside; placing central line at this time.

## 2019-04-27 NOTE — Death Summary Note (Signed)
DEATH SUMMARY   Patient Details  Name: Ebony Flores MRN: [id number] DOB: Jul 14, 1960  Admission/Discharge Information   Admit Date:  04/03/2019  Date of Death: Date of Death: April 03, 2019  Time of Death: Time of Death: 07-13-36  Length of Stay: 1  Referring Physician: Randa Spike, DO   Reason(s) for Hospitalization  Severe ACUTE Hypoxic and Hypercapnic Respiratory Failure from acute cardiogenic shock and acute cardiac failure ?pneumonia -continue Full MV support -continue Bronchodilator Therapy -Wean Fio2 and PEEP as tolerated  SHE IS NOT A CANDIDATE FOR HYPOTHERMIA PROTOCOL DUE TO RECURRENT CARDIAC ARREST AND PROCESS OF DYING   ACUTE SYSTOLIC CARDIAC FAILURE- EF unknwon -follow up cardiac enzymes as indicated    ACUTE KIDNEY INJURY/Renal Failure -follow chem 7 -follow UO -continue Foley Catheter-assess need -Avoid nephrotoxic agents  NEUROLOGY GCS<3T Poor prognosis  SHOCK-SEPSIS/HYPOVOLUMIC/CARDIOGENIC -use vasopressors to keep MAP>65 -follow ABG and LA -follow up cultures -emperic ABX -consider stress dose steroids -aggressive IV fluid resuscitation  CARDIAC ICU monitoring  ID -continue IV abx as prescibed -follow up cultures  GI GI PROPHYLAXIS as indicated  NUTRITIONAL STATUS DIET-->NPO Constipation protocol as indicated   ENDO - will use ICU hypoglycemic\Hyperglycemia protocol if needed    ELECTROLYTES -follow labs as needed -replace as needed -pharmacy consultation and following  Family At bedside, clinical status relayed to family  Updated and notified of patients medical condition-  Progressive multiorgan failure with very low chance of meaningful recovery.  Patient is in dying  Process.  Family understands the situation.  They have consented and agreed to DNR/DNI and patient eventually passed      Diagnoses  Preliminary cause of death: ISCHEMIC CARDIOMYOPATHY, PNEUMONIA Secondary Diagnoses (including  complications and co-morbidities):  Principal Problem:   CAP (community acquired pneumonia) Active Problems:   Acute respiratory failure with hypoxia (HCC)   Elevated troponin   Sepsis (HCC)       Pertinent Labs and Studies  Significant Diagnostic Studies DG Chest 1 View  Result Date: 04/03/2019 CLINICAL DATA:  Shortness of breath EXAM: CHEST  1 VIEW COMPARISON:  None. FINDINGS: Bilateral pulmonary opacity that is extensive and asymmetric to the right. There could be pleural fluid on the right. Superimposed heart size appears normal. No evidence of pneumothorax. IMPRESSION: Extensive bilateral pneumonia with possible right pleural fluid. Electronically Signed   By: Marnee Spring M.D.   On: 2019/04/03 04:40   CT Angio Chest PE W and/or Wo Contrast  Result Date: 04-03-2019 CLINICAL DATA:  Shortness of breath, nonproductive cough, acutely worsening EXAM: CT ANGIOGRAPHY CHEST WITH CONTRAST TECHNIQUE: Multidetector CT imaging of the chest was performed using the standard protocol during bolus administration of intravenous contrast. Multiplanar CT image reconstructions and MIPs were obtained to evaluate the vascular anatomy. CONTRAST:  61mL OMNIPAQUE IOHEXOL 350 MG/ML SOLN COMPARISON:  Radiograph 04-03-19 FINDINGS: Cardiovascular: Satisfactory opacification the pulmonary arteries to the segmental level. No pulmonary artery filling defects are identified. Central pulmonary arteries are normal caliber. Cardiac size at the upper limits of normal. No pericardial effusion. The aorta is normal caliber. Normal 3 vessel branching of the arch. Proximal great vessels opacified normally. Mediastinum/Nodes: Scattered subcentimeter low-attenuation mediastinal and hilar nodes. No pathologically enlarged mediastinal, hilar or axillary adenopathy. Moderate hiatal hernia. No acute tracheal abnormality. Thyroid gland and thoracic inlet are unremarkable. Lungs/Pleura: There is bilobed mixed consolidative, reticular  and ground-glass opacity throughout both lungs including areas of bronchial dilatation within regions of ground-glass most notably in the left lung base and throughout the right hemithorax. Small  right pleural effusion is present with some dependent atelectasis posteriorly. Upper Abdomen: No acute abnormalities present in the visualized portions of the upper abdomen. Musculoskeletal: Multilevel degenerative changes are present in the imaged portions of the spine. No acute osseous abnormality or suspicious osseous lesion. Probable hemangioma in the T9 vertebral body. Rudimentary cervical ribs are noted. Review of the MIP images confirms the above findings. IMPRESSION: 1. No evidence for pulmonary embolus. 2. Multi focal mixed consolidative, reticular and ground-glass opacity throughout both lungs including areas of bronchial dilatation within regions of ground-glass opacity. Findings could reflect an acute infectious process including atypical viral pneumonia/pneumonitis and/or features of ARDS. 3. Small right pleural effusion. 4. Moderate hiatal hernia. Electronically Signed   By: Kreg Shropshire M.D.   On: 06-Apr-2019 06:36   DG Chest Portable 1 View  Result Date: 04/06/19 CLINICAL DATA:  Post intubation. EXAM: PORTABLE CHEST 1 VIEW COMPARISON:  CT chest and chest x-ray from same day. FINDINGS: Endotracheal tube tip 1.8 cm above the carina. The cardiomediastinal silhouette is obscured. Extensive right greater than left lung airspace disease has worsened since the prior chest x-ray. No pneumothorax or large pleural effusion. No acute osseous abnormality. IMPRESSION: 1. Appropriately positioned endotracheal tube. 2. Worsened extensive right greater than left lung airspace disease potentially reflecting infection and/or ARDS. Electronically Signed   By: Obie Dredge M.D.   On: 04-06-2019 13:43    Microbiology Recent Results (from the past 240 hour(s))  Blood culture (routine x 2)     Status: None (Preliminary  result)   Collection Time: Apr 06, 2019  5:25 AM   Specimen: BLOOD  Result Value Ref Range Status   Specimen Description BLOOD RIGHT [facility]  Final   Special Requests   Final    BOTTLES DRAWN AEROBIC AND ANAEROBIC Blood Culture adequate volume   Culture   Final    NO GROWTH 2 DAYS Performed at [facility], [address], [place], [place] [address]    Report Status PENDING  Incomplete  Blood culture (routine x 2)     Status: None (Preliminary result)   Collection Time: 04-06-19  5:25 AM   Specimen: BLOOD  Result Value Ref Range Status   Specimen Description BLOOD RIGHT ARM  Final   Special Requests   Final    BOTTLES DRAWN AEROBIC AND ANAEROBIC Blood Culture adequate volume   Culture   Final    NO GROWTH 2 DAYS Performed at [facility], [address], [place], [place] [address]    Report Status PENDING  Incomplete  Respiratory Panel by RT PCR (Flu A&B, Covid) - Nasopharyngeal Swab     Status: None   Collection Time: 2019-04-06  6:52 AM   Specimen: Nasopharyngeal Swab  Result Value Ref Range Status   SARS Coronavirus 2 by RT PCR NEGATIVE NEGATIVE Final    Comment: (NOTE) SARS-CoV-2 target nucleic acids are NOT DETECTED. The SARS-CoV-2 RNA is generally detectable in upper respiratoy specimens during the acute phase of infection. The lowest concentration of SARS-CoV-2 viral copies this assay can detect is 131 copies/mL. A negative result does not preclude SARS-Cov-2 infection and should not be used as the sole basis for treatment or other patient management decisions. A negative result may occur with  improper specimen collection/handling, submission of specimen other than nasopharyngeal swab, presence of viral mutation(s) within the areas targeted by this assay, and inadequate number of viral copies (<131 copies/mL). A negative result must be combined with clinical observations, patient history, and epidemiological information.  The expected result is  Negative. Fact Sheet for Patients:  PinkCheek.be Fact Sheet for Healthcare Providers:  GravelBags.it This test is not yet ap proved or cleared by the [place] FDA and  has been authorized for detection and/or diagnosis of SARS-CoV-2 by FDA under an Emergency Use Authorization (EUA). This EUA will remain  in effect (meaning this test can be used) for the duration of the COVID-19 declaration under Section 564(b)(1) of the Act, 21 U.S.C. section 360bbb-3(b)(1), unless the authorization is terminated or revoked sooner.    Influenza A by PCR NEGATIVE NEGATIVE Final   Influenza B by PCR NEGATIVE NEGATIVE Final    Comment: (NOTE) The Xpert Xpress SARS-CoV-2/FLU/RSV assay is intended as an aid in  the diagnosis of influenza from Nasopharyngeal swab specimens and  should not be used as a sole basis for treatment. Nasal washings and  aspirates are unacceptable for Xpert Xpress SARS-CoV-2/FLU/RSV  testing. Fact Sheet for Patients: PinkCheek.be Fact Sheet for Healthcare Providers: GravelBags.it This test is not yet approved or cleared by the [place] FDA and  has been authorized for detection and/or diagnosis of SARS-CoV-2 by  FDA under an Emergency Use Authorization (EUA). This EUA will remain  in effect (meaning this test can be used) for the duration of the  Covid-19 declaration under Section 564(b)(1) of the Act, 21  U.S.C. section 360bbb-3(b)(1), unless the authorization is  terminated or revoked. Performed at [facility], [address] [place], [place] [address]     Lab Basic Metabolic Panel: Recent Labs  Lab 04-04-19 0418  NA 135  K 4.0  CL 101  CO2 23  GLUCOSE 210*  BUN 14  CREATININE 1.05*  CALCIUM 8.0*   Liver Function Tests: Recent Labs  Lab Apr 04, 2019 0418  AST 41  ALT 18  ALKPHOS 109  BILITOT 0.8  PROT 6.8  ALBUMIN 2.6*    No results for input(s): LIPASE, AMYLASE in the last 168 hours. No results for input(s): AMMONIA in the last 168 hours. CBC: Recent Labs  Lab Apr 04, 2019 0418  WBC 28.9*  NEUTROABS 24.6*  HGB 8.4*  HCT 31.1*  MCV 61.6*  PLT 1,011*   Cardiac Enzymes: No results for input(s): CKTOTAL, CKMB, CKMBINDEX, TROPONINI in the last 168 hours. Sepsis Labs: Recent Labs  Lab 04-04-2019 0418 Apr 04, 2019 0525 2019-04-04 0838  PROCALCITON  --  0.75  --   WBC 28.9*  --   --   LATICACIDVEN  --  3.7* 2.6*   CVL, ETT, VENT SUPPORT    Jaysiah Marchetta 04/02/2019, 1:08 PM

## 2019-04-27 NOTE — H&P (Signed)
History and Physical    Amaziah Ghosh PPI:[id number] DOB: 05-14-1960 DOA: 26-Apr-2019  Referring MD/NP/PA:   PCP: Randa Spike, DO   Patient coming from:  The patient is coming from home.  At baseline, pt is independent for most of ADL.        Chief Complaint: SOB  HPI: Ebony Flores is a 59 y.o. female with PMH of cholecystectomy, presents with SOB  Pt stats that she has been sick for more than on week. She has been having generalized weakness, fatigue, chills, dry cough, shortness of breath and some diarrhea. No CP or abdominal pain.  Over the last week, she initially felt better, but over the past 2-3 days has had acute worsening of severe SOB. She has oxygen desaturation to 70s the ED, initially saturation 95% on 7 L oxygen, but she deteriorated with severe respiratory distress requiring BiPAP. She had some diarrhea recently, but not today.  Patient does not have nausea, vomiting, abdominal pain currently.  No symptoms of UTI.  ED Course: pt was found to have negative Covid Ag, negative RVP for Covid, pending ABG, WBC 28.9, troponin 35, lactic acid 3.7, electrolytes renal function okay, temperature 98, blood pressure 127/75, tachycardia, tachypnea, chest x-ray showed a bilateral effusion.  CT angiogram is negative for PE, but showed bilateral diffuse groundglass infiltration.  Patient is admitted to stepdown as inpatient.  Critical care, Dr. Jayme Cloud was consulted by EDP. I also personally called Dr. Jayme Cloud by phone.  Review of Systems:   General: has fevers, chills, no body weight gain, has poor appetite, has fatigue HEENT: no blurry vision, hearing changes or sore throat Respiratory: has severe dyspnea, coughing, wheezing CV: no chest pain, no palpitations GI: no nausea, vomiting, abdominal pain, diarrhea, constipation GU: no dysuria, burning on urination, increased urinary frequency, hematuria  Ext: no leg edema Neuro: no unilateral weakness, numbness, or tingling, no  vision change or hearing loss Skin: no rash, no skin tear. MSK: No muscle spasm, no deformity, no limitation of range of movement in spin Heme: No easy bruising.  Travel history: No recent long distant travel.  Allergy: No Known Allergies  History reviewed. No pertinent past medical history.  Past Surgical History:  Procedure Laterality Date  . CHOLECYSTECTOMY      Social History:  reports that she has never smoked. She has never used smokeless tobacco. No history on file for alcohol and drug.  Family History:  Family History  Problem Relation Age of Onset  . Diabetes Mellitus II Father      Prior to Admission medications   Medication Sig Start Date End Date Taking? Authorizing Provider  acetaminophen (TYLENOL) 325 MG tablet Take 650 mg by mouth every 6 (six) hours as needed.   Yes [provider]    Physical Exam: Vitals:   04/26/2019 1415 2019-04-26 1425 26-Apr-2019 1500 Apr 26, 2019 1550  BP: 136/70 (!) 110/46 98/70   Pulse:      Resp: (!) 24 (!) 24 (!) 24   Temp:   (!) 93.7 F (34.3 C)   TempSrc:      SpO2: (!) 0% (!) 71%    Weight:    74.8 kg  Height:    5\' 3"  (1.6 m)   General: in severe acute respiratory distress. HEENT:       Eyes: PERRL, EOMI, no scleral icterus.       ENT: No discharge from the ears and nose, no pharynx injection, no tonsillar enlargement.  Neck: No JVD, no bruit, no mass felt. Heme: No neck lymph node enlargement. Cardiac: S1/S2, RRR, No murmurs, No gallops or rubs. Respiratory: has rhonchi and wheezing bilaterally. GI: Soft, nondistended, nontender, no rebound pain, no organomegaly, BS present. GU: No hematuria Ext: No pitting leg edema bilaterally. 2+DP/PT pulse bilaterally. Musculoskeletal: No joint deformities, No joint redness or warmth, no limitation of ROM in spin. Skin: No rashes.  Neuro: Alert, oriented X3, cranial nerves II-XII grossly intact, moves all extremities normally. Labs on Admission: I have personally reviewed  following labs and imaging studies  CBC: Recent Labs  Lab 2019-10-11 0418  WBC 28.9*  NEUTROABS 24.6*  HGB 8.4*  HCT 31.1*  MCV 61.6*  PLT 1,011*   Basic Metabolic Panel: Recent Labs  Lab 2019-10-11 0418  NA 135  K 4.0  CL 101  CO2 23  GLUCOSE 210*  BUN 14  CREATININE 1.05*  CALCIUM 8.0*   GFR: Estimated Creatinine Clearance: 57.3 mL/min (A) (by C-G formula based on SCr of 1.05 mg/dL (H)). Liver Function Tests: Recent Labs  Lab 2019-10-11 0418  AST 41  ALT 18  ALKPHOS 109  BILITOT 0.8  PROT 6.8  ALBUMIN 2.6*   No results for input(s): LIPASE, AMYLASE in the last 168 hours. No results for input(s): AMMONIA in the last 168 hours. Coagulation Profile: No results for input(s): INR, PROTIME in the last 168 hours. Cardiac Enzymes: No results for input(s): CKTOTAL, CKMB, CKMBINDEX, TROPONINI in the last 168 hours. BNP (last 3 results) No results for input(s): PROBNP in the last 8760 hours. HbA1C: No results for input(s): HGBA1C in the last 72 hours. CBG: No results for input(s): GLUCAP in the last 168 hours. Lipid Profile: No results for input(s): CHOL, HDL, LDLCALC, TRIG, CHOLHDL, LDLDIRECT in the last 72 hours. Thyroid Function Tests: No results for input(s): TSH, T4TOTAL, FREET4, T3FREE, THYROIDAB in the last 72 hours. Anemia Panel: Recent Labs    2019-10-11 0525  FERRITIN 15   Urine analysis: No results found for: COLORURINE, APPEARANCEUR, LABSPEC, PHURINE, GLUCOSEU, HGBUR, BILIRUBINUR, KETONESUR, PROTEINUR, UROBILINOGEN, NITRITE, LEUKOCYTESUR Sepsis Labs: @LABRCNTIP (procalcitonin:4,lacticidven:4) ) Recent Results (from the past 240 hour(s))  Blood culture (routine x 2)     Status: None (Preliminary result)   Collection Time: 2019-10-11  5:25 AM   Specimen: BLOOD  Result Value Ref Range Status   Spec[facility] Description [facility]  Final   Special Requests   Final    BOTTLES DRAWN AEROBIC AND ANAEROBIC Blood Culture adequate volume   Culture   Final    NO  GROWTH <12 HOURS Performed at [facility], [address] [place], [place] [address]    Report Status PENDING  Incomplete  Blood culture (routine x 2)     Status: None (Preliminary result)   Collection Time: 2019-10-11  5:25 AM   Specimen: BLOOD  Result Value Ref Range Status   Specimen Description BLOOD RIGHT ARM  Final   Special Requests   Final    BOTTLES DRAWN AEROBIC AND ANAEROBIC Blood Culture adequate volume   Culture   Final    NO GROWTH <12 HOURS Performed at [facility], [address] [place], [place] [address]    Report Status PENDING  Incomplete  Respiratory Panel by RT PCR (Flu A&B, Covid) - Nasopharyngeal Swab     Status: None   Collection Time: 2019-10-11  6:52 AM   Specimen: Nasopharyngeal Swab  Result Value Ref Range Status   SARS Coronavirus 2 by RT PCR  NEGATIVE NEGATIVE Final    Comment: (NOTE) SARS-CoV-2 target nucleic acids are NOT DETECTED. The SARS-CoV-2 RNA is generally detectable in upper respiratoy specimens during the acute phase of infection. The lowest concentration of SARS-CoV-2 viral copies this assay can detect is 131 copies/mL. A negative result does not preclude SARS-Cov-2 infection and should not be used as the sole basis for treatment or other patient management decisions. A negative result may occur with  improper specimen collection/handling, submission of specimen other than nasopharyngeal swab, presence of viral mutation(s) within the areas targeted by this assay, and inadequate number of viral copies (<131 copies/mL). A negative result must be combined with clinical observations, patient history, and epidemiological information. The expected result is Negative. Fact Sheet for Patients:  PinkCheek.be Fact Sheet for Healthcare Providers:  GravelBags.it This test is not yet ap proved or cleared by the [place] FDA and  has been authorized for detection  and/or diagnosis of SARS-CoV-2 by FDA under an Emergency Use Authorization (EUA). This EUA will remain  in effect (meaning this test can be used) for the duration of the COVID-19 declaration under Section 564(b)(1) of the Act, 21 U.S.C. section 360bbb-3(b)(1), unless the authorization is terminated or revoked sooner.    Influenza A by PCR NEGATIVE NEGATIVE Final   Influenza B by PCR NEGATIVE NEGATIVE Final    Comment: (NOTE) The Xpert Xpress SARS-CoV-2/FLU/RSV assay is intended as an aid in  the diagnosis of influenza from Nasopharyngeal swab specimens and  should not be used as a sole basis for treatment. Nasal washings and  aspirates are unacceptable for Xpert Xpress SARS-CoV-2/FLU/RSV  testing. Fact Sheet for Patients: PinkCheek.be Fact Sheet for Healthcare Providers: GravelBags.it This test is not yet approved or cleared by the [place] FDA and  has been authorized for detection and/or diagnosis of SARS-CoV-2 by  FDA under an Emergency Use Authorization (EUA). This EUA will remain  in effect (meaning this test can be used) for the duration of the  Covid-19 declaration under Section 564(b)(1) of the Act, 21  U.S.C. section 360bbb-3(b)(1), unless the authorization is  terminated or revoked. Performed at [facility], [address] [place], [place] [address]      Radiological Exams on Admission: DG Chest 1 View  Result Date: 04-25-2019 CLINICAL DATA:  Shortness of breath EXAM: CHEST  1 VIEW COMPARISON:  None. FINDINGS: Bilateral pulmonary opacity that is extensive and asymmetric to the right. There could be pleural fluid on the right. Superimposed heart size appears normal. No evidence of pneumothorax. IMPRESSION: Extensive bilateral pneumonia with possible right pleural fluid. Electronically Signed   By: Monte Fantasia M.D.   On: 04-25-19 04:40   CT Angio Chest PE W and/or Wo Contrast  Result Date:  04-25-19 CLINICAL DATA:  Shortness of breath, nonproductive cough, acutely worsening EXAM: CT ANGIOGRAPHY CHEST WITH CONTRAST TECHNIQUE: Multidetector CT imaging of the chest was performed using the standard protocol during bolus administration of intravenous contrast. Multiplanar CT image reconstructions and MIPs were obtained to evaluate the vascular anatomy. CONTRAST:  49mL OMNIPAQUE IOHEXOL 350 MG/ML SOLN COMPARISON:  Radiograph Apr 25, 2019 FINDINGS: Cardiovascular: Satisfactory opacification the pulmonary arteries to the segmental level. No pulmonary artery filling defects are identified. Central pulmonary arteries are normal caliber. Cardiac size at the upper limits of normal. No pericardial effusion. The aorta is normal caliber. Normal 3 vessel branching of the arch. Proximal great vessels opacified normally. Mediastinum/Nodes: Scattered subcentimeter low-attenuation mediastinal and hilar nodes. No pathologically enlarged mediastinal, hilar or axillary adenopathy.  Moderate hiatal hernia. No acute tracheal abnormality. Thyroid gland and thoracic inlet are unremarkable. Lungs/Pleura: There is bilobed mixed consolidative, reticular and ground-glass opacity throughout both lungs including areas of bronchial dilatation within regions of ground-glass most notably in the left lung base and throughout the right hemithorax. Small right pleural effusion is present with some dependent atelectasis posteriorly. Upper Abdomen: No acute abnormalities present in the visualized portions of the upper abdomen. Musculoskeletal: Multilevel degenerative changes are present in the imaged portions of the spine. No acute osseous abnormality or suspicious osseous lesion. Probable hemangioma in the T9 vertebral body. Rudimentary cervical ribs are noted. Review of the MIP images confirms the above findings. IMPRESSION: 1. No evidence for pulmonary embolus. 2. Multi focal mixed consolidative, reticular and ground-glass opacity throughout  both lungs including areas of bronchial dilatation within regions of ground-glass opacity. Findings could reflect an acute infectious process including atypical viral pneumonia/pneumonitis and/or features of ARDS. 3. Small right pleural effusion. 4. Moderate hiatal hernia. Electronically Signed   By: Kreg Shropshire M.D.   On: 04/27/2019 06:36   DG Chest Portable 1 View  Result Date: 04-27-19 CLINICAL DATA:  Post intubation. EXAM: PORTABLE CHEST 1 VIEW COMPARISON:  CT chest and chest x-ray from same day. FINDINGS: Endotracheal tube tip 1.8 cm above the carina. The cardiomediastinal silhouette is obscured. Extensive right greater than left lung airspace disease has worsened since the prior chest x-ray. No pneumothorax or large pleural effusion. No acute osseous abnormality. IMPRESSION: 1. Appropriately positioned endotracheal tube. 2. Worsened extensive right greater than left lung airspace disease potentially reflecting infection and/or ARDS. Electronically Signed   By: Obie Dredge M.D.   On: Apr 27, 2019 13:43     EKG: Independently reviewed.  Sinus rhythm, tachycardia, QTC 454, LAE  Assessment/Plan Principal Problem:   CAP (community acquired pneumonia) Active Problems:   Acute respiratory failure with hypoxia (HCC)   Elevated troponin   Sepsis (HCC)   Acute respiratory failure with hypoxia due to CAP (community acquired pneumonia) and sepsis due to CAP: CT angiogram is negative for PE, but showed bilateral diffuse groundglass infiltration.  Patient had negative RVP for COVID-19 and COVID-19 Ag test, but still has high suspicion for COVID-19 infection.  Patient is septic with leukocytosis, tachycardia and tachypnea.  Lactic acid is elevated 3.7.  Currently hemodynamically stable. Will wait for another 24 hours to get covid19 test again to r/o Covid 19.  Patient deteriorated, requiring BiPAP currently.  PCCM, Dr. Jayme Cloud was consulted. I personally also called Dr. Jayme Cloud by phone.  - Will  place on SUD as inpt under PUI - on BiPAP - IV Rocephin and azithromycin (patient received 1 dose of vancomycin in ED) - Mucinex for cough  - Atrovent inhaler, prn albuterol inhaler - Urine legionella and S. pneumococcal antigen - Follow up blood culture x2, sputum culture - will get Procalcitonin and trend lactic acid level per sepsis protocol - IVF: 2.5L of NS bolus in ED, followed by 75 mL per hour of NStreatment).  Addendum: pt continues to be deteriorating.  Patient was intubated by critical care, Dr. Jayme Cloud. Pt is transferred to ICU.  Elevated troponin: Troponin 35, likely due to demand ischemia. -Start aspirin 81 mg daily -Check A1c, FLP, UDS -Trend troponin -Repeat EKG in morning  Inpatient status:  # Patient requires inpatient status due to high intensity of service, high risk for further deterioration and high frequency of surveillance required.  I certify that at the point of admission it is my clinical judgment  that the patient will require inpatient hospital care spanning beyond 2 midnights from the point of admission.  . Now patient has presenting with acute respiratory failure with hypoxia due to CAP (community acquired pneumonia) and sepsis, elevated trop . The initial radiographic and laboratory data are worrisome because of elevated lactic acid, positive troponin, bilateral infiltration on chest x-ray and bilateral diffused groundglass infiltration on CT angiogram. . Current medical needs: please see my assessment and plan . Predictability of an adverse outcome (risk): Patient has multiple comorbidities as listed above. Now presenting with acute respiratory failure with hypoxia due to CAP (community acquired pneumonia) and sepsis, elevated trop.  Patient needs BiPAP.  His presentation is highly complicated.  Patient is at high risk of deteriorating.  Will need to be treated in hospital for at least 2 days.    DVT ppx: SQ Lovenox Code Status: Full code Family  Communication: None at bed side.   Disposition Plan:  Anticipate discharge back to previous home environment Consults called: ICU, Dr. Jayme Cloud Admission status: SDU/inpation       Date of Service April 10, 2019    Lorretta Harp [facility]   If 7PM-7AM, please contact night-coverage [email or web address] Password [email or web address] April 10, 2019, 7:05 PM

## 2019-04-27 DEATH — deceased

## 2021-01-04 IMAGING — DX DG CHEST 1V PORT
1 series · 1 of 1 positions shown · non-contrast
Comparison: CT chest and chest x-ray from same day.

CLINICAL DATA: Post intubation.

EXAM:
PORTABLE CHEST 1 VIEW

[chest ap]
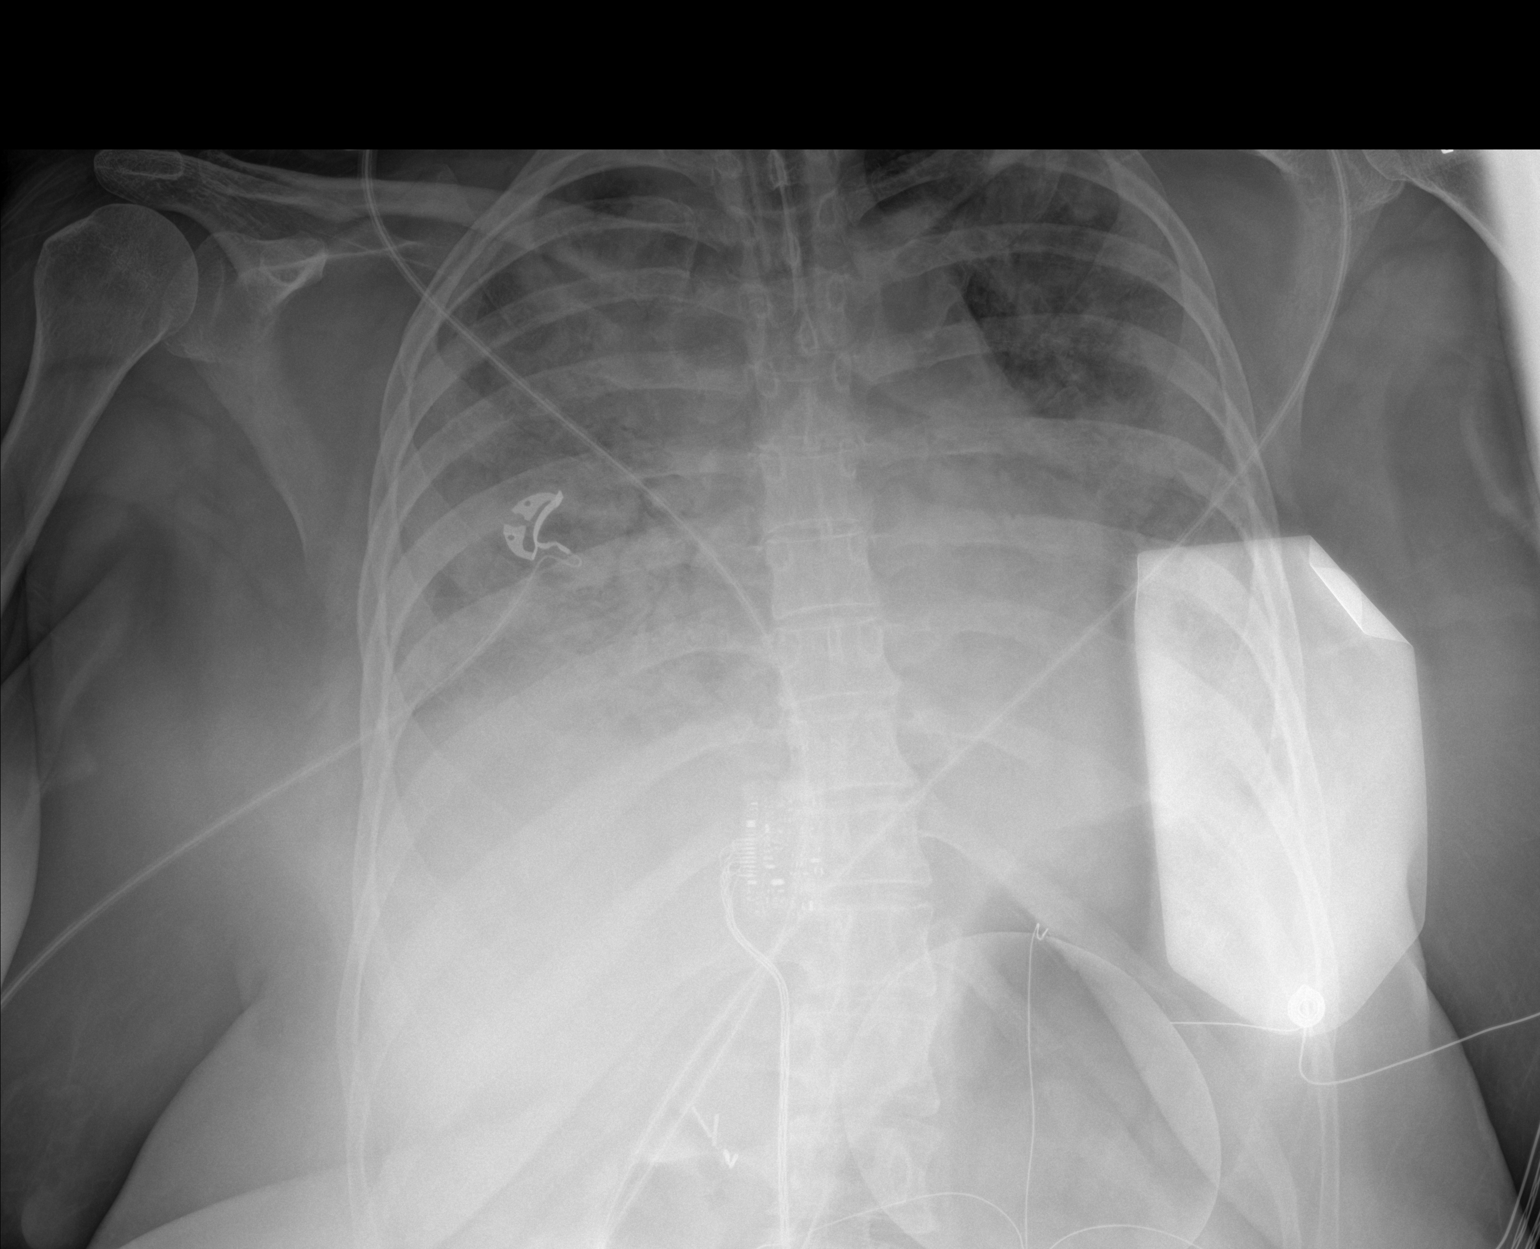

[1 of 1 positions shown; findings below may reference images not displayed]

FINDINGS: Endotracheal tube tip 1.8 cm above the carina. The cardiomediastinal
silhouette is obscured. Extensive right greater than left lung
airspace disease has worsened since the prior chest x-ray. No
pneumothorax or large pleural effusion. No acute osseous
abnormality.
IMPRESSION: 1. Appropriately positioned endotracheal tube.
2. Worsened extensive right greater than left lung airspace disease
potentially reflecting infection and/or ARDS.
